# Patient Record
Sex: Male | Born: 1965 | Race: Black or African American | Hispanic: No | Marital: Married | Smoking: Never smoker
Health system: Southern US, Community
[De-identification: ages and names within clinical notes are randomized; demographics above are authoritative.]

## PROBLEM LIST (undated history)

## (undated) DIAGNOSIS — M2550 Pain in unspecified joint: Secondary | ICD-10-CM

## (undated) DIAGNOSIS — Z8611 Personal history of tuberculosis: Secondary | ICD-10-CM

## (undated) DIAGNOSIS — J189 Pneumonia, unspecified organism: Secondary | ICD-10-CM

## (undated) DIAGNOSIS — Z973 Presence of spectacles and contact lenses: Secondary | ICD-10-CM

## (undated) HISTORY — DX: Presence of spectacles and contact lenses: Z97.3

## (undated) HISTORY — PX: CERVICAL DISCECTOMY: SHX98

## (undated) HISTORY — DX: Personal history of tuberculosis: Z86.11

## (undated) HISTORY — PX: LEG SURGERY: SHX1003

## (undated) HISTORY — PX: LIPOMA EXCISION: SHX5283

## (undated) HISTORY — DX: Pain in unspecified joint: M25.50

## (undated) HISTORY — DX: Pneumonia, unspecified organism: J18.9

## (undated) HISTORY — PX: NECK SURGERY: SHX720

---

## 2003-12-09 ENCOUNTER — Emergency Department (HOSPITAL_COMMUNITY): Admission: EM | Admit: 2003-12-09 | Discharge: 2003-12-09 | Payer: Self-pay | Admitting: Emergency Medicine

## 2005-02-03 ENCOUNTER — Ambulatory Visit: Payer: Self-pay | Admitting: Internal Medicine

## 2005-02-10 ENCOUNTER — Ambulatory Visit: Payer: Self-pay | Admitting: Nurse Practitioner

## 2005-02-10 ENCOUNTER — Ambulatory Visit: Payer: Self-pay | Admitting: *Deleted

## 2005-02-13 ENCOUNTER — Ambulatory Visit (HOSPITAL_COMMUNITY): Admission: RE | Admit: 2005-02-13 | Discharge: 2005-02-13 | Payer: Self-pay | Admitting: Internal Medicine

## 2005-02-22 ENCOUNTER — Ambulatory Visit: Payer: Self-pay | Admitting: Nurse Practitioner

## 2005-02-28 ENCOUNTER — Emergency Department (HOSPITAL_COMMUNITY): Admission: EM | Admit: 2005-02-28 | Discharge: 2005-03-01 | Payer: Self-pay | Admitting: Emergency Medicine

## 2005-03-01 ENCOUNTER — Ambulatory Visit: Payer: Self-pay | Admitting: Nurse Practitioner

## 2005-03-09 ENCOUNTER — Ambulatory Visit (HOSPITAL_COMMUNITY): Admission: RE | Admit: 2005-03-09 | Discharge: 2005-03-09 | Payer: Self-pay | Admitting: Neurosurgery

## 2005-03-14 ENCOUNTER — Encounter: Admission: RE | Admit: 2005-03-14 | Discharge: 2005-03-14 | Payer: Self-pay | Admitting: Neurosurgery

## 2005-04-04 ENCOUNTER — Ambulatory Visit (HOSPITAL_COMMUNITY): Admission: RE | Admit: 2005-04-04 | Discharge: 2005-04-05 | Payer: Self-pay | Admitting: Neurosurgery

## 2006-07-25 IMAGING — CT CT CERVICAL SPINE W/ CM
3 of 7 series · 14 of 33 positions shown, 16 images · IV contrast (omnipaque)
Comparison: none

CLINICAL DATA: Prior C6-7 fusion.  Clinical concern for right C7-T1 HNP.  Right C8 radiculopathy.  
CERVICAL MYELOGRAM:
TECHNIQUE: Dr. Derriecka Reib instilled Omnipaque 300 into the subarachnoid space at L3-4 level.  I maneuvered the contrast material into the cervical region and obtained standard spot films.

[Series 102: cervical spine · axial · 0.27mm/px · z∈[-213,-70]mm · 8 of 368 slices shown, 10 images]
[im 41/368  soft-tissue]
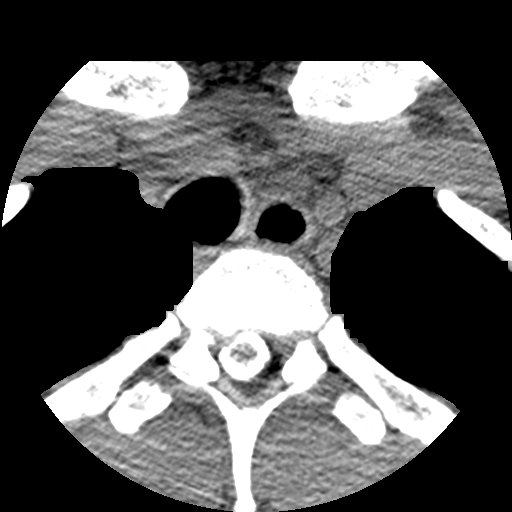
[im 41/368  bone]
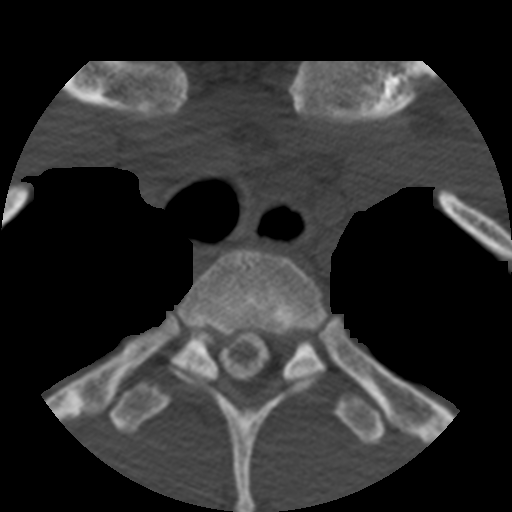
[im 82/368  bone]
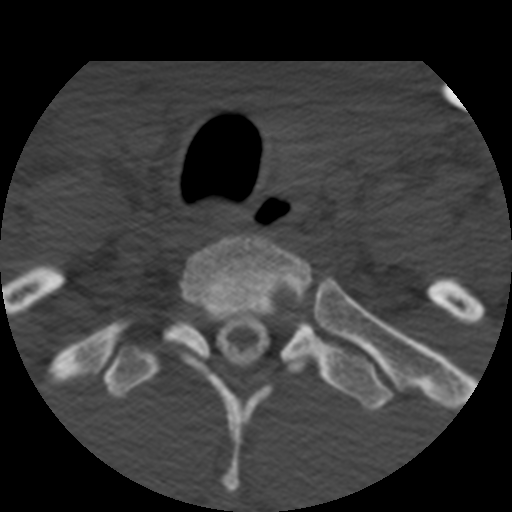
[im 123/368  bone]
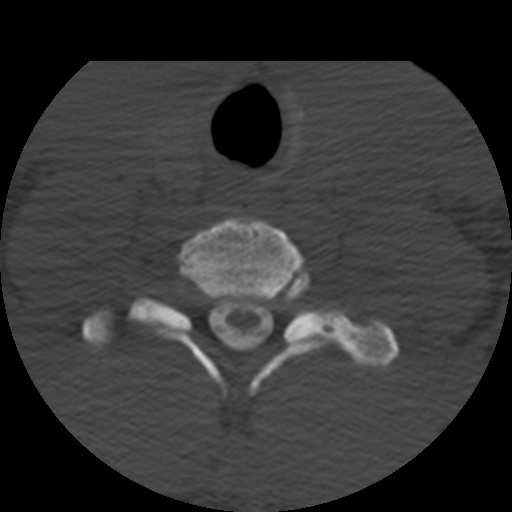
[im 164/368  bone]
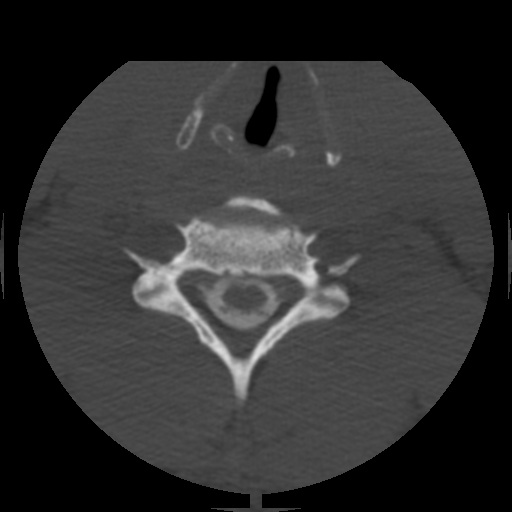
[im 204/368  soft-tissue]
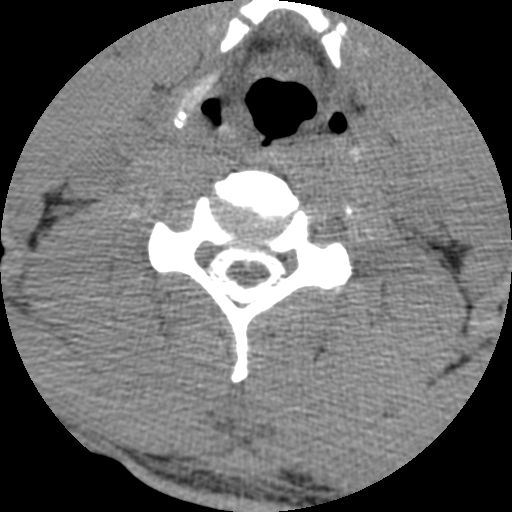
[im 204/368  bone]
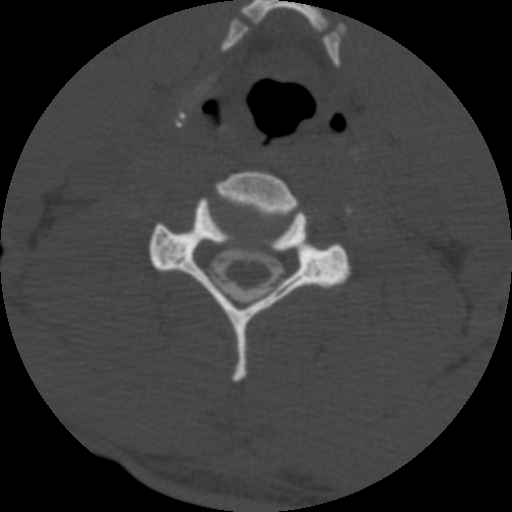
[im 245/368  bone]
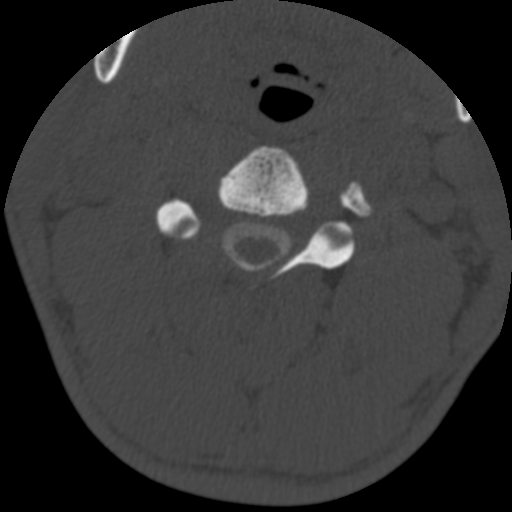
[im 286/368  bone]
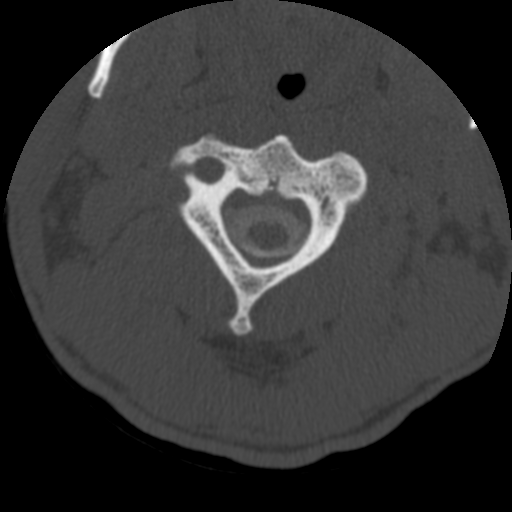
[im 327/368  bone]
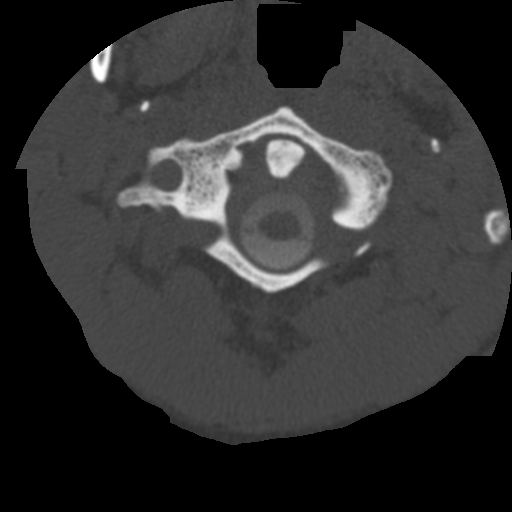

[Series 103: reformatted · sagittal · 0.35mm/px · 5 of 49 slices shown (1 of 2)]
[im 9/49  bone]
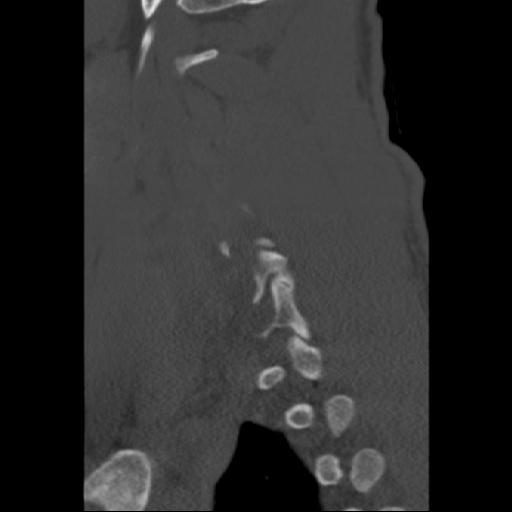
[im 17/49  bone]
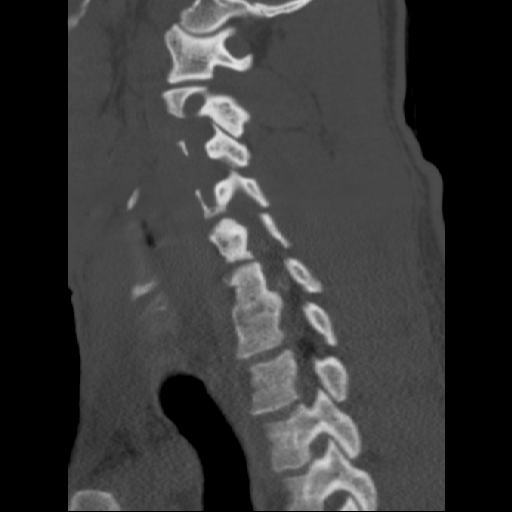
[im 25/49  bone]
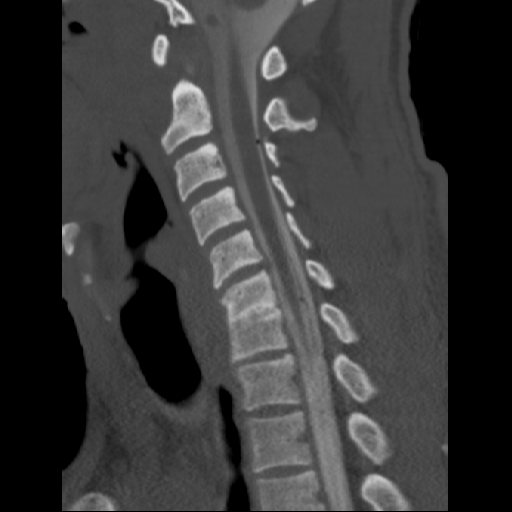
[im 33/49  bone]
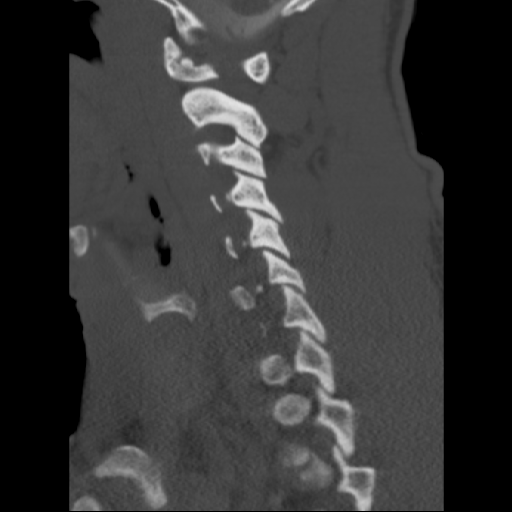
[im 41/49  bone]
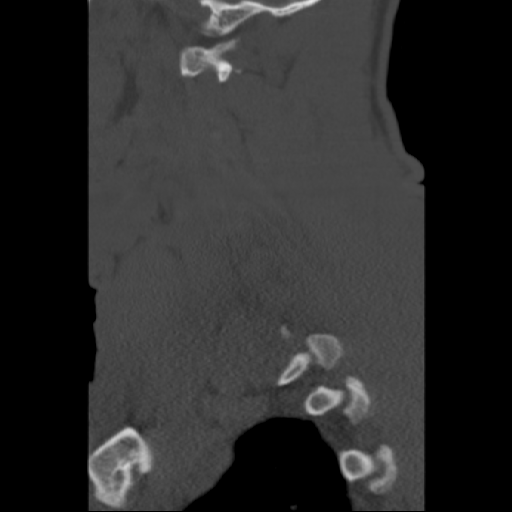

[Series 104: reformatted · coronal · 0.37mm/px · 1 of 40 slices shown (2 of 2)]
[im 20/40  bone]
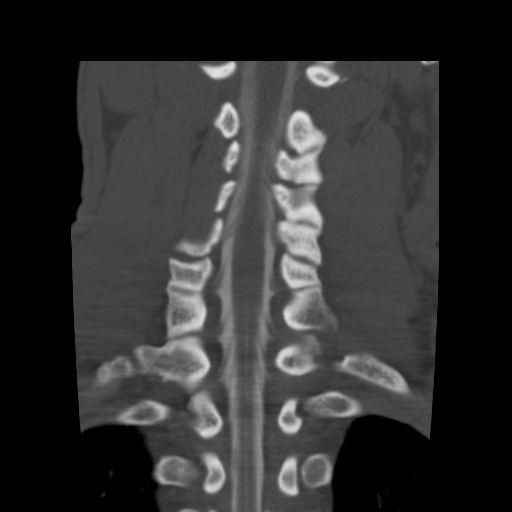

[14 of 33 positions shown; findings below may reference images not displayed]

FINDINGS: Evidence of prior interbody fusion, C6-7, which appears solid.  Minimal anterior extradural defect centrally at C6-7.  Anterolateral extradural defects, C5-6 mainly on the left, probably due to foraminal stenosis and moderately small disk protrusion as noted on MRI 02/13/05.  Anterolateral extradural defect on the right at C6-7 due to bony hypertrophy of the vertebral bodies/uncinate process.   The MRI revealed disk protrusions posterior lateral/lateral, right greater than left at C7-T1 plus bony hypertrophy.  It is difficult to optimally appreciate the extradural defects on the oblique myelographic films.  CT is to follow.
IMPRESSION: Extradural defects as described above.  
POST MYELOGRAM CT CERVICAL SPINE:
Following myelogram, transaxial cuts were acquired. From the axial data set, images were constructed in coronal and sagittal planes.  
Lower cervical scoliosis convex to the right.  Solid appearing interbody fusion at C6-7.  Mild kyphotic angulation centered at C6-7.  Bony hypertrophy of the vertebral bodies on the right at C6-7 with some foraminal encroachment.  Findings compatible with HNP.  Posterolateral/lateral disk herniation on the right at C7-T1 with encroachment on the right C8 nerve root.  Smaller disk protrusion and osteophyte formation posterolateral on the left at C7-T1.  Mild eccentric annular bulging on the right at T1-2.  HNP and bony hypertrophy posterior lateral/lateral right T2-3.  Indentation anterolateral thecal sac due to bony hypertrophy. 
Also appreciated is disk protrusion, posterolateral on the left at C5-6.
IMPRESSION: Multiple disk protrusions as described above.  Main clinical question was whether there was an HNP on the right at C7-T1 in this patient with reported right T8 radiculopathy.  Note that there are findings compatible with HNP and spondylotic changes laterally on the right at C7-T1.  See comments above.

## 2006-07-25 IMAGING — CR DG MYELOGRAM CERVICAL
2 series · 2 of 2 positions shown · IV contrast (omnipaque)
Comparison: none

CLINICAL DATA: Prior C6-7 fusion.  Clinical concern for right C7-T1 HNP.  Right C8 radiculopathy.  
CERVICAL MYELOGRAM:
TECHNIQUE: Dr. Derriecka Reib instilled Omnipaque 300 into the subarachnoid space at L3-4 level.  I maneuvered the contrast material into the cervical region and obtained standard spot films.

[view not recorded (1 of 2)]
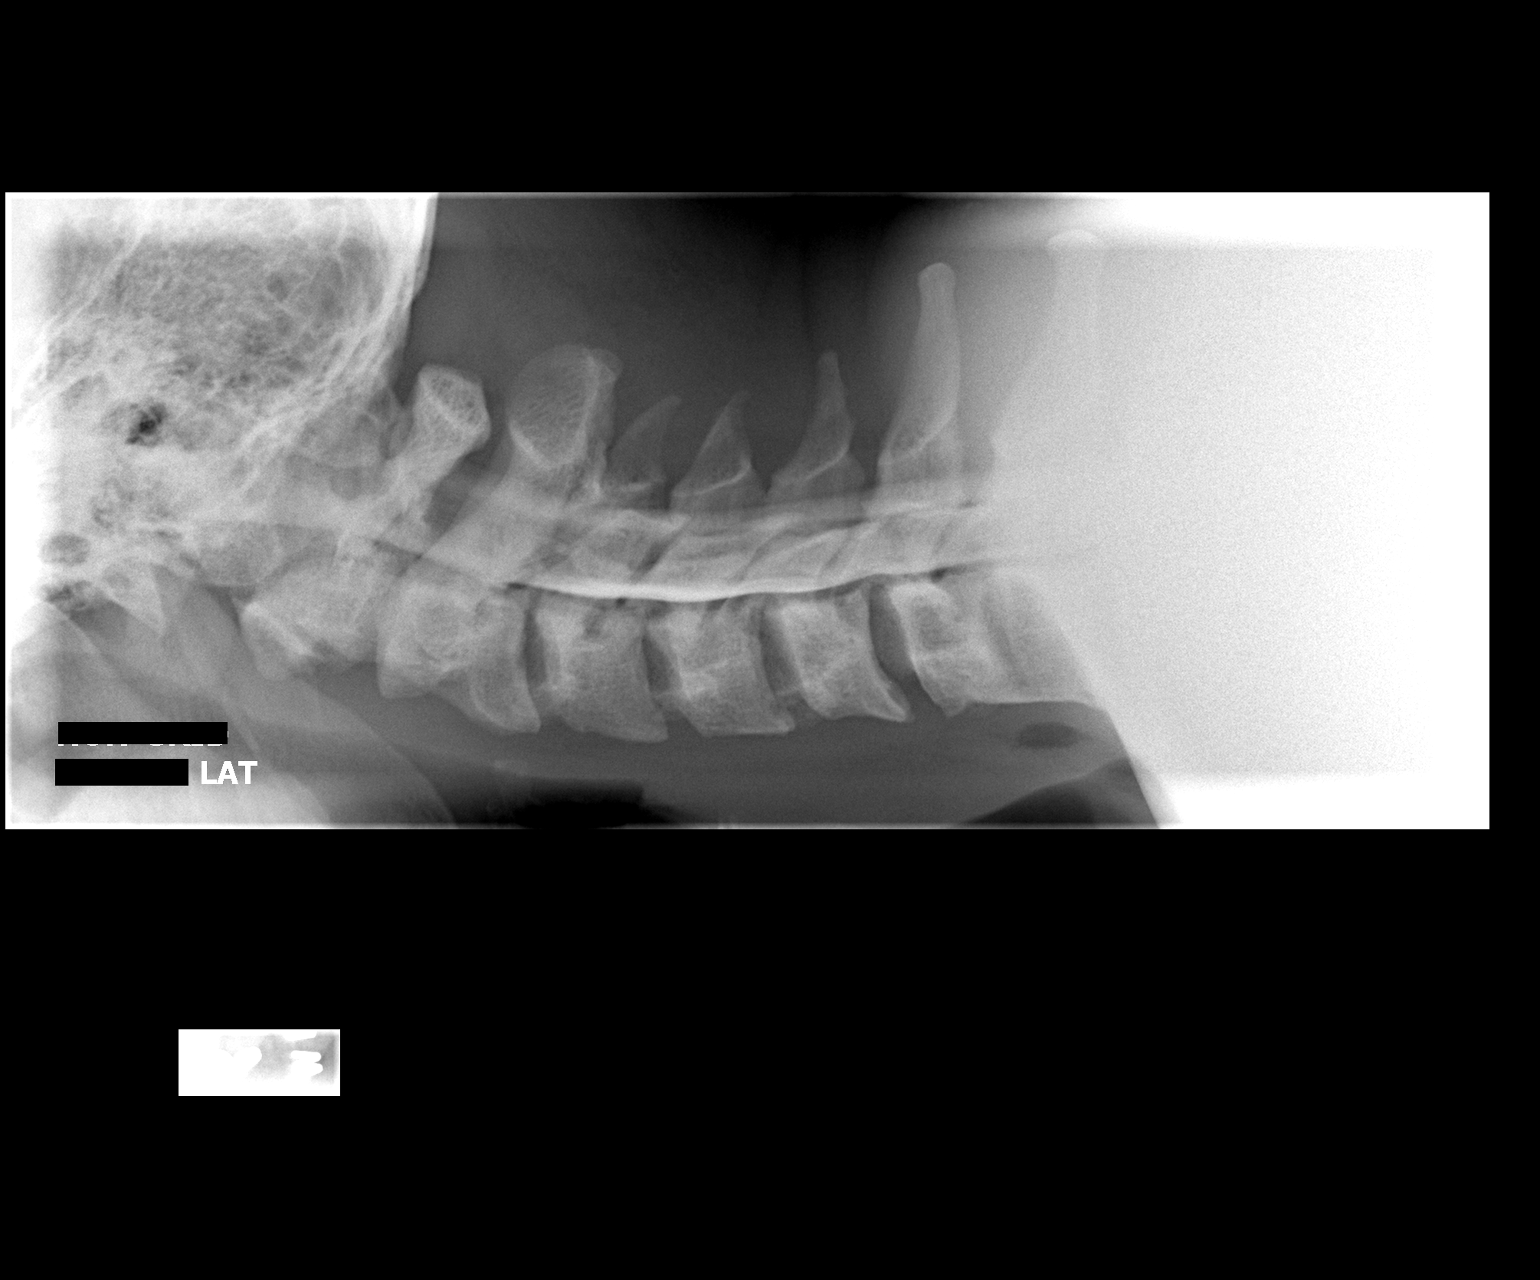

[view not recorded (2 of 2)]
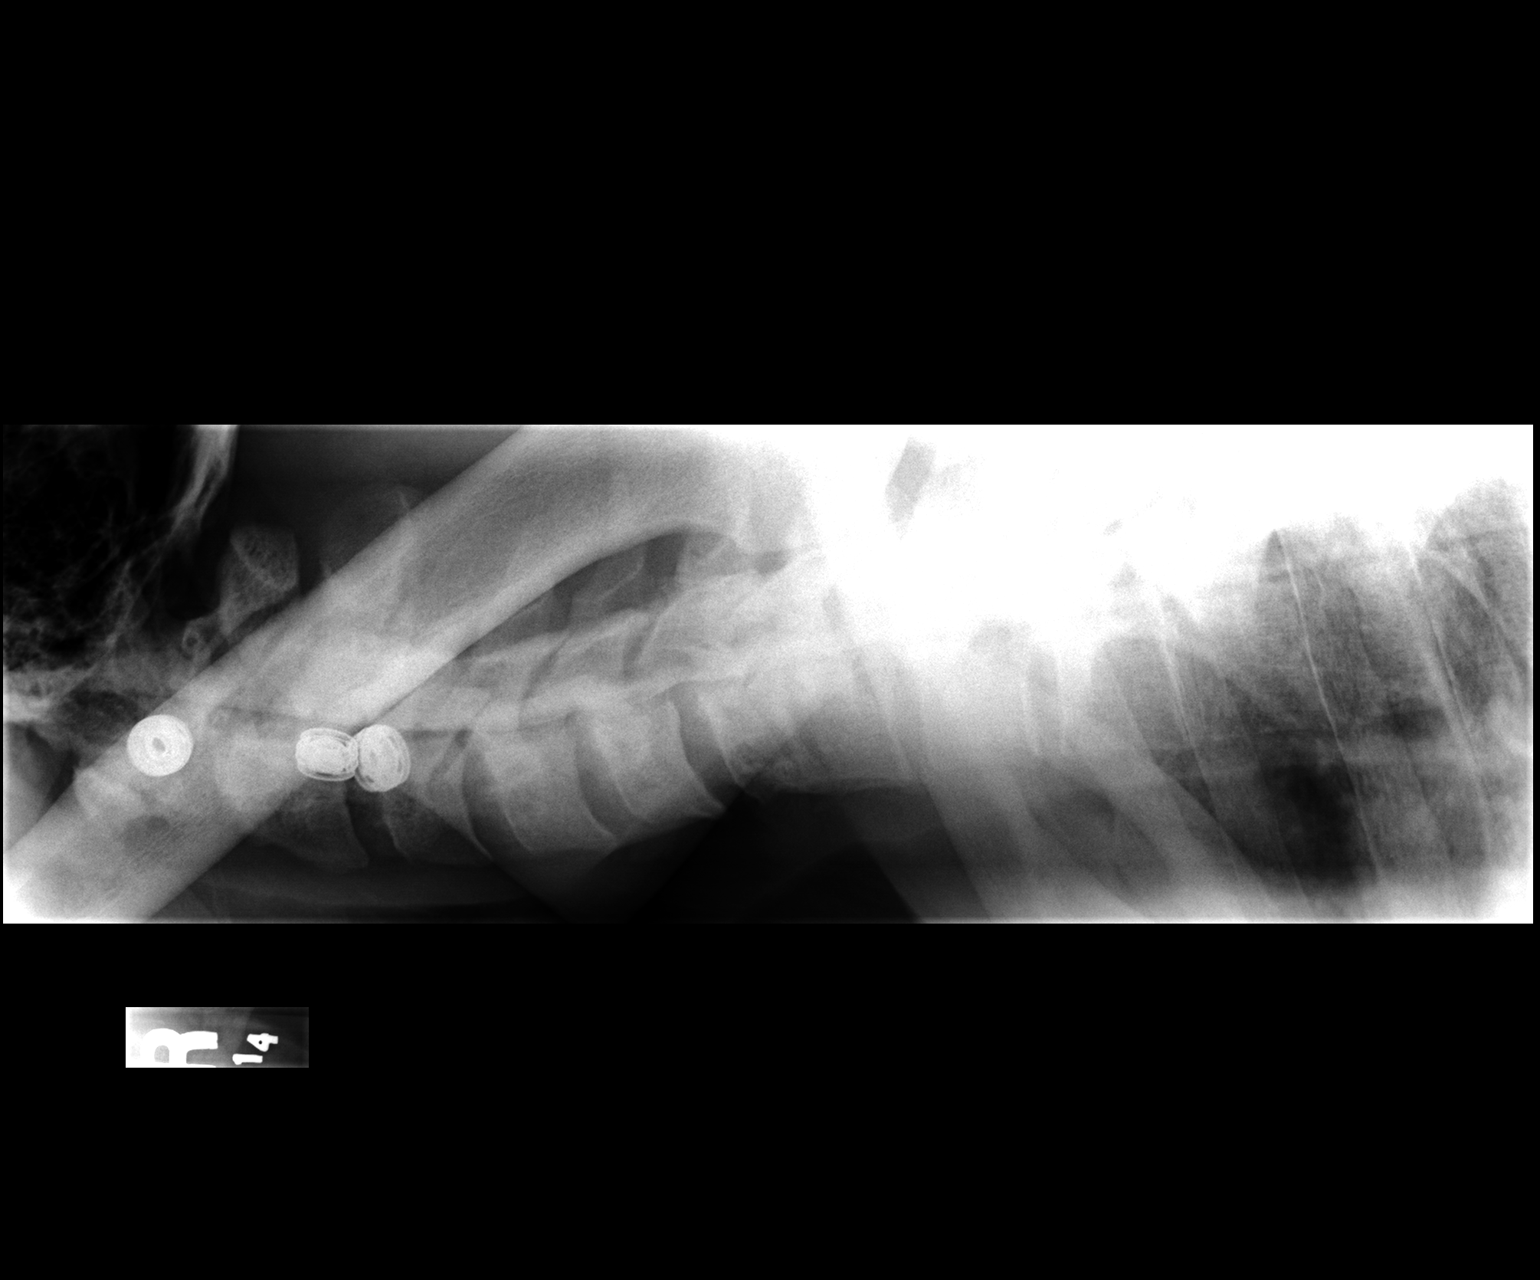

[2 of 2 positions shown; findings below may reference images not displayed]

FINDINGS: Evidence of prior interbody fusion, C6-7, which appears solid.  Minimal anterior extradural defect centrally at C6-7.  Anterolateral extradural defects, C5-6 mainly on the left, probably due to foraminal stenosis and moderately small disk protrusion as noted on MRI 02/13/05.  Anterolateral extradural defect on the right at C6-7 due to bony hypertrophy of the vertebral bodies/uncinate process.   The MRI revealed disk protrusions posterior lateral/lateral, right greater than left at C7-T1 plus bony hypertrophy.  It is difficult to optimally appreciate the extradural defects on the oblique myelographic films.  CT is to follow.
IMPRESSION: Extradural defects as described above.  
POST MYELOGRAM CT CERVICAL SPINE:
Following myelogram, transaxial cuts were acquired. From the axial data set, images were constructed in coronal and sagittal planes.  
Lower cervical scoliosis convex to the right.  Solid appearing interbody fusion at C6-7.  Mild kyphotic angulation centered at C6-7.  Bony hypertrophy of the vertebral bodies on the right at C6-7 with some foraminal encroachment.  Findings compatible with HNP.  Posterolateral/lateral disk herniation on the right at C7-T1 with encroachment on the right C8 nerve root.  Smaller disk protrusion and osteophyte formation posterolateral on the left at C7-T1.  Mild eccentric annular bulging on the right at T1-2.  HNP and bony hypertrophy posterior lateral/lateral right T2-3.  Indentation anterolateral thecal sac due to bony hypertrophy. 
Also appreciated is disk protrusion, posterolateral on the left at C5-6.
IMPRESSION: Multiple disk protrusions as described above.  Main clinical question was whether there was an HNP on the right at C7-T1 in this patient with reported right T8 radiculopathy.  Note that there are findings compatible with HNP and spondylotic changes laterally on the right at C7-T1.  See comments above.

## 2010-04-15 ENCOUNTER — Ambulatory Visit: Payer: Self-pay | Admitting: Family Medicine

## 2010-04-21 ENCOUNTER — Encounter: Admission: RE | Admit: 2010-04-21 | Discharge: 2010-04-21 | Payer: Self-pay | Admitting: Family Medicine

## 2010-06-30 ENCOUNTER — Ambulatory Visit: Payer: Self-pay | Admitting: Family Medicine

## 2010-07-16 ENCOUNTER — Ambulatory Visit: Payer: Self-pay | Admitting: Family Medicine

## 2010-07-27 ENCOUNTER — Ambulatory Visit (HOSPITAL_BASED_OUTPATIENT_CLINIC_OR_DEPARTMENT_OTHER): Admission: RE | Admit: 2010-07-27 | Discharge: 2010-07-27 | Payer: Self-pay | Admitting: General Surgery

## 2010-12-06 ENCOUNTER — Other Ambulatory Visit: Payer: Self-pay | Admitting: Emergency Medicine

## 2010-12-06 ENCOUNTER — Other Ambulatory Visit: Payer: Self-pay | Admitting: Orthopedic Surgery

## 2010-12-06 DIAGNOSIS — M25561 Pain in right knee: Secondary | ICD-10-CM

## 2010-12-06 DIAGNOSIS — R531 Weakness: Secondary | ICD-10-CM

## 2010-12-07 ENCOUNTER — Ambulatory Visit
Admission: RE | Admit: 2010-12-07 | Discharge: 2010-12-07 | Disposition: A | Discharge status: Home / Self Care | Payer: BC Managed Care – PPO | Source: Ambulatory Visit | Attending: Orthopedic Surgery | Admitting: Orthopedic Surgery

## 2010-12-07 DIAGNOSIS — M25561 Pain in right knee: Secondary | ICD-10-CM

## 2010-12-07 DIAGNOSIS — R531 Weakness: Secondary | ICD-10-CM

## 2011-03-11 NOTE — Op Note (Signed)
NAME:  JAHMAI, FINELLI NO.:  [id number]   MEDICAL RECORD NO.:  [id number]          PATIENT TYPE:  OIB   LOCATION:  2899                         FACILITY:  [facility]   PHYSICIAN:  Clydene Fake, M.D.  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   OPERATION PERFORMED:  Anterior cervical decompression and diskectomy with  fusion,  C7-T1 with LifeNet allograft bone. Eagle anterior cervical plate.   SURGEON:  Clydene Fake, M.D.   ASSISTANT:  Stefani Dama, M.D.   ANESTHESIA:  General endotracheal.   ESTIMATED BLOOD LOSS:  200 mL   BLOOD REPLACED:  None.   DRAINS:  None.   COMPLICATIONS:  None.   INDICATIONS FOR PROCEDURE:  The patient is a 45 year old gentleman who  underwent ACF to C6-7 in 1999 and was doing well, but started having neck  and right arm pain, numbness and weakness in the C8 distribution.  He had an  MRI and a myelogram was done showing spondylitic changes at C7-T1 with  __________ narrowing worse at the right side, probable disk herniation  compressing the C8 root.  Patient brought in for decompression and fusion.   DESCRIPTION OF PROCEDURE:  The patient was brought to the operating room and  general anesthesia induced.  The patient was placed in 10 pounds of halter  traction, was prepped and draped in sterile fashion.  The site of incision  was injected with 10 mL of 1% lidocaine with epinephrine.  Incision was then  made in the left side of the neck below the prior incision from the midline  to the anterior border of the sternocleidomastoid muscle.  Incision taken  down to the platysma and  hemostasis obtained with Bovie cautery and the  platysma was incised with the Bovie and blunt dissection was taken to the  anterior cervical fascia.  On the anterior cervical spine there was some  scar in this area.  We carefully dissected to and through and got to the  spine and at the level of the osteophyte what we thought was C7-T1, the  needle was placed in the interspace and x-rays were obtained, confirming our  position.  Longus coli muscles were reflected laterally on each side and  self-retaining retractor was place.  Disk space was incised with a 15 blade  and diskectomy performed with pituitary rongeurs and curets.  Distraction  pins were placed into the C7-T1 vertebral bodies  and interspace distracted.  Microscope was brought in for microdissection.  Using curets and pituitary  rongeurs and then 1 and 2 mm Kerrison punches, diskectomy was then  continued.  Posterior osteophytes were removed.  Posterior ligament removed.  The central canal within the bilateral foramina were decompressed.  We did  extensive foraminal  decompression on the right side,  decompressing out over  the nerve root fairly far laterally.  When we were finished, we had good  central decompression and good lateral decompression on both sides with no  compression on either C8 nerve root.  Hemostasis was obtained with bipolar  cautery, Gelfoam and Thrombin.  Gelfoam was then irrigated out.  We left a  small piece of Gelfoam to the right side of the take off of the C8 root.  That kept the area hemostatic.  High-speed drill was used to remove  cartilaginous end plate. The height of the disk space was measured with the  LifeNet trial was measured 6 mm.  A 6 mm LifeNet allograft bone was then  tapped into place, countersunk about a millimeter.  We checked posterior to  the graft.  There was plenty of room between the dura and the graft.  This  was checked with a nerve hook.  Wound was irrigated with antibiotic   solution.  Distraction pins were removed.  Weight was removed from the  traction and an Eagle anterior cervical plate was placed over the anterior  cervical spine and two screws placed in C7, two in T1. These were tightened  down.  Lateral x-rays were obtained showing good position of plate and  screws and interbody plug at the C7-T1 level.  Retractors were removed.  Hemostasis obtained with bipolar cautery and Gelfoam and Thrombin. Gelfoam  was irrigated out.  When we had good hemostasis, the platysma was closed  with 3-0 Vicryl interrupted suture.  The subcutaneous tissue was closed with  same and the skin closed with benzoin and Steri-Strips.  Dressing was  placed.  Patient was placed back in supine position, awakened from  anesthesia and transferred to the recovery room in stable condition.       JRH/MEDQ  D:  04/04/2005  T:  04/04/2005  Job:  [id number]

## 2012-01-10 ENCOUNTER — Ambulatory Visit (INDEPENDENT_AMBULATORY_CARE_PROVIDER_SITE_OTHER): Payer: 59 | Admitting: Medical

## 2012-01-10 ENCOUNTER — Encounter: Payer: Self-pay | Admitting: Medical

## 2012-01-10 VITALS — BP 112/70 | HR 68 | Temp 98.0°F | Resp 16 | Ht 71.0 in | Wt 186.0 lb

## 2012-01-10 DIAGNOSIS — Z113 Encounter for screening for infections with a predominantly sexual mode of transmission: Secondary | ICD-10-CM

## 2012-01-10 DIAGNOSIS — M25561 Pain in right knee: Secondary | ICD-10-CM

## 2012-01-10 DIAGNOSIS — Z0000 Encounter for general adult medical examination without abnormal findings: Secondary | ICD-10-CM

## 2012-01-10 DIAGNOSIS — M25532 Pain in left wrist: Secondary | ICD-10-CM

## 2012-01-10 DIAGNOSIS — M25569 Pain in unspecified knee: Secondary | ICD-10-CM

## 2012-01-10 DIAGNOSIS — R634 Abnormal weight loss: Secondary | ICD-10-CM

## 2012-01-10 DIAGNOSIS — M25539 Pain in unspecified wrist: Secondary | ICD-10-CM

## 2012-01-10 DIAGNOSIS — Z809 Family history of malignant neoplasm, unspecified: Secondary | ICD-10-CM

## 2012-01-10 LAB — LIPID PANEL
HDL: 88 mg/dL (ref >39)
LDL Cholesterol: 111 mg/dL — ABNORMAL HIGH (ref 0–99)

## 2012-01-10 LAB — PSA: PSA: 0.57 ng/mL (ref <=4.00)

## 2012-01-10 LAB — COMPREHENSIVE METABOLIC PANEL
ALT: 13 U/L (ref 0–53)
Albumin: 4.5 g/dL (ref 3.5–5.2)
CO2: 23 mEq/L (ref 19–32)
Glucose, Bld: 85 mg/dL (ref 70–99)
Potassium: 4.4 mEq/L (ref 3.5–5.3)
Sodium: 137 mEq/L (ref 135–145)
Total Protein: 7.5 g/dL (ref 6.0–8.3)

## 2012-01-10 LAB — POCT URINALYSIS DIPSTICK
Bilirubin, UA: NEGATIVE
Glucose, UA: NEGATIVE
Ketones, UA: NEGATIVE
Spec Grav, UA: 1.02

## 2012-01-10 LAB — TSH: TSH: 1.203 u[IU]/mL (ref 0.350–4.500)

## 2012-01-10 MED ORDER — MELOXICAM 15 MG PO TABS: 15.0000 mg | ORAL_TABLET | Freq: Every day | ORAL | Status: AC
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
## 2012-01-11 LAB — GC/CHLAMYDIA PROBE AMP, URINE
Chlamydia, Swab/Urine, PCR: NEGATIVE
GC Probe Amp, Urine: NEGATIVE

## 2012-01-11 LAB — CBC WITH DIFFERENTIAL/PLATELET
Basophils Absolute: 0 10*3/uL (ref 0.0–0.1)
Eosinophils Absolute: 0.1 10*3/uL (ref 0.0–0.7)
Lymphs Abs: 1.7 10*3/uL (ref 0.7–4.0)
MCH: 30.4 pg (ref 26.0–34.0)
Neutrophils Relative %: 47 % (ref 43–77)
Platelets: 245 10*3/uL (ref 150–400)
RBC: 4.54 MIL/uL (ref 4.22–5.81)
RDW: 13.4 % (ref 11.5–15.5)
WBC: 4.1 10*3/uL (ref 4.0–10.5)

## 2012-01-11 LAB — SEDIMENTATION RATE: Sed Rate: 1 mm/hr (ref 0–16)

## 2012-01-11 LAB — HIV ANTIBODY (ROUTINE TESTING W REFLEX): HIV: NONREACTIVE

## 2012-01-31 ENCOUNTER — Telehealth: Payer: Self-pay | Admitting: Family Medicine

## 2012-01-31 NOTE — Telephone Encounter (Signed)
Message copied by Janeice Robinson on Tue Jan 31, 2012  4:13 PM ------      Message from: Jac Canavan      Created: Tue Jan 31, 2012  1:38 PM       Chest xray overread by radiologist showed normal CXR.  He is supposed to come back in for weight check 4/19

## 2012-01-31 NOTE — Telephone Encounter (Signed)
Patient was notified of his CXR report and to F/U for a weight check. CLS

## 2012-02-01 ENCOUNTER — Encounter: Payer: Self-pay | Admitting: Internal Medicine

## 2012-02-10 ENCOUNTER — Other Ambulatory Visit: Payer: 59

## 2013-07-24 ENCOUNTER — Encounter: Payer: Self-pay | Admitting: Medical

## 2013-07-30 ENCOUNTER — Encounter: Payer: Self-pay | Admitting: Medical

## 2013-07-30 ENCOUNTER — Ambulatory Visit (INDEPENDENT_AMBULATORY_CARE_PROVIDER_SITE_OTHER): Payer: Managed Care, Other (non HMO) | Admitting: Medical

## 2013-07-30 VITALS — BP 112/80 | HR 58 | Temp 97.7°F | Resp 16 | Ht 71.0 in | Wt 194.0 lb

## 2013-07-30 DIAGNOSIS — M19049 Primary osteoarthritis, unspecified hand: Secondary | ICD-10-CM

## 2013-07-30 DIAGNOSIS — Z0000 Encounter for general adult medical examination without abnormal findings: Secondary | ICD-10-CM

## 2013-07-30 DIAGNOSIS — L03019 Cellulitis of unspecified finger: Secondary | ICD-10-CM

## 2013-07-30 DIAGNOSIS — M65839 Other synovitis and tenosynovitis, unspecified forearm: Secondary | ICD-10-CM

## 2013-07-30 DIAGNOSIS — M659 Synovitis and tenosynovitis, unspecified: Secondary | ICD-10-CM

## 2013-07-30 DIAGNOSIS — L03012 Cellulitis of left finger: Secondary | ICD-10-CM

## 2013-07-30 DIAGNOSIS — Z23 Encounter for immunization: Secondary | ICD-10-CM

## 2013-07-30 DIAGNOSIS — M19042 Primary osteoarthritis, left hand: Secondary | ICD-10-CM

## 2013-07-30 LAB — POCT URINALYSIS DIPSTICK
Blood, UA: NEGATIVE
Ketones, UA: NEGATIVE
Protein, UA: NEGATIVE
Spec Grav, UA: 1.02
Urobilinogen, UA: NEGATIVE
pH, UA: 5

## 2013-07-30 MED ORDER — MELOXICAM 15 MG PO TABS: 15.0000 mg | ORAL_TABLET | Freq: Every day | ORAL | Status: DC

## 2013-07-30 MED ORDER — CEPHALEXIN 500 MG PO CAPS: 500.0000 mg | ORAL_CAPSULE | Freq: Three times a day (TID) | ORAL | Status: DC
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
## 2013-07-30 NOTE — Patient Instructions (Signed)
Left pinky finger swelling  It appears that you had recent paronychia infection.  This usually requires incision and drainage, but it appears to be healing.  I will write a prescription for Keflex antibiotic though you can take to help clear this up.  Take all of the antibiotic.   You also have swelling of the finger joint suggesting osteoarthritis.  Begin Mobic anti-inflammatory daily to help with swelling and pain. Take this with food.  Your left wrist pain is due to tenosynovitis or inflammation of the tendon sheath and joint.  Mobic should help with this as well.  Use mobic daily for the next month and see how this helps.   Use ice when swollen or worse.    My main recommendations today are to limit alcohol, continue staying active and exercising, eat a healthy low cholesterol diet.  If you want the skin tag removed, then return for excision.   Paronychia Paronychia is an inflammatory reaction involving the folds of the skin surrounding the fingernail. This is commonly caused by an infection in the skin around a nail. The most common cause of paronychia is frequent wetting of the hands (as seen with bartenders, food servers, nurses or others who wet their hands). This makes the skin around the fingernail susceptible to infection by bacteria (germs) or fungus. Other predisposing factors are:  Aggressive manicuring.  Nail biting.  Thumb sucking. The most common cause is a staphylococcal (a type of germ) infection, or a fungal (Candida) infection. When caused by a germ, it usually comes on suddenly with redness, swelling, pus and is often painful. It may get under the nail and form an abscess (collection of pus), or form an abscess around the nail. If the nail itself is infected with a fungus, the treatment is usually prolonged and may require oral medicine for up to one year. Your caregiver will determine the length of time treatment is required. The paronychia caused by bacteria (germs) may  largely be avoided by not pulling on hangnails or picking at cuticles. When the infection occurs at the tips of the finger it is called felon. When the cause of paronychia is from the herpes simplex virus (HSV) it is called herpetic whitlow. TREATMENT  When an abscess is present treatment is often incision and drainage. This means that the abscess must be cut open so the pus can get out. When this is done, the following home care instructions should be followed. HOME CARE INSTRUCTIONS   It is important to keep the affected fingers very dry. Rubber or plastic gloves over cotton gloves should be used whenever the hand must be placed in water.  Keep wound clean, dry and dressed as suggested by your caregiver between warm soaks or warm compresses.  Soak in warm water for fifteen to twenty minutes three to four times per day for bacterial infections. Fungal infections are very difficult to treat, so often require treatment for long periods of time.  For bacterial (germ) infections take antibiotics (medicine which kill germs) as directed and finish the prescription, even if the problem appears to be solved before the medicine is gone.  Only take over-the-counter or prescription medicines for pain, discomfort, or fever as directed by your caregiver. SEEK IMMEDIATE MEDICAL CARE IF:  You have redness, swelling, or increasing pain in the wound.  You notice pus coming from the wound.  You have a fever.  You notice a bad smell coming from the wound or dressing. Document Released: 04/05/2001 Document Revised:  01/02/2012 Document Reviewed: 12/05/2008 [employer] Patient Information 2014 [place], [place].     Osteoarthritis Osteoarthritis is the most common form of arthritis. It is redness, soreness, and swelling (inflammation) affecting the cartilage. Cartilage acts as a cushion, covering the ends of bones where they meet to form a joint. CAUSES  Over time, the cartilage begins to wear away. This causes  bone to rub on bone. This produces pain and stiffness in the affected joints. Factors that contribute to this problem are:  Excessive body weight.  Age.  Overuse of joints. SYMPTOMS   People with osteoarthritis usually experience joint pain, swelling, or stiffness.  Over time, the joint may lose its normal shape.  Small deposits of bone (osteophytes) may grow on the edges of the joint.  Bits of bone or cartilage can break off and float inside the joint space. This may cause more pain and damage.  Osteoarthritis can lead to depression, anxiety, feelings of helplessness, and limitations on daily activities. The most commonly affected joints are in the:  Ends of the fingers.  Thumbs.  Neck.  Lower back.  Knees.  Hips. DIAGNOSIS  Diagnosis is mostly based on your symptoms and exam. Tests may be helpful, including:  X-rays of the affected joint.  A computerized magnetic scan (MRI).  Blood tests to rule out other types of arthritis.  Joint fluid tests. This involves using a needle to draw fluid from the joint and examining the fluid under a microscope. TREATMENT  Goals of treatment are to control pain, improve joint function, maintain a normal body weight, and maintain a healthy lifestyle. Treatment approaches may include:  A prescribed exercise program with rest and joint relief.  Weight control with nutritional education.  Pain relief techniques such as:  Properly applied heat and cold.  Electric pulses delivered to nerve endings under the skin (transcutaneous electrical nerve stimulation, TENS).  Massage.  Certain supplements. Ask your caregiver before using any supplements, especially in combination with prescribed drugs.  Medicines to control pain, such as:  Acetaminophen.  Nonsteroidal anti-inflammatory drugs (NSAIDs), such as naproxen.  Narcotic or central-acting agents, such as tramadol. This drug carries a risk of addiction and is generally  prescribed for short-term use.  Corticosteroids. These can be given orally or as injection. This is a short-term treatment, not recommended for routine use.  Surgery to reposition the bones and relieve pain (osteotomy) or to remove loose pieces of bone and cartilage. Joint replacement may be needed in advanced states of osteoarthritis. HOME CARE INSTRUCTIONS  Your caregiver can recommend specific types of exercise. These may include:  Strengthening exercises. These are done to strengthen the muscles that support joints affected by arthritis. They can be performed with weights or with exercise bands to add resistance.  Aerobic activities. These are exercises, such as brisk walking or low-impact aerobics, that get your heart pumping. They can help keep your lungs and circulatory system in shape.  Range-of-motion activities. These keep your joints limber.  Balance and agility exercises. These help you maintain daily living skills. Learning about your condition and being actively involved in your care will help improve the course of your osteoarthritis. SEEK MEDICAL CARE IF:   You feel hot or your skin turns red.  You develop a rash in addition to your joint pain.  You have an oral temperature above 102 F (38.9 C). FOR MORE INFORMATION  National Institute of Arthritis and Musculoskeletal and Skin Diseases: www.niams.http://www.myers.net/ [employer] on Aging: https://walker.com/ American College of Rheumatology: www.rheumatology.org  Document Released: 10/10/2005 Document Revised: 01/02/2012 Document Reviewed: 01/21/2010 [facility] Patient Information 2014 [place], [place].

## 2013-12-18 ENCOUNTER — Other Ambulatory Visit: Payer: Self-pay | Admitting: Medical

## 2013-12-18 NOTE — Telephone Encounter (Signed)
Is this okay to refill? 

## 2016-08-12 ENCOUNTER — Ambulatory Visit (INDEPENDENT_AMBULATORY_CARE_PROVIDER_SITE_OTHER): Payer: Managed Care, Other (non HMO) | Admitting: Medical

## 2016-08-12 ENCOUNTER — Encounter: Payer: Self-pay | Admitting: Medical

## 2016-08-12 VITALS — BP 120/88 | HR 62 | Temp 98.0°F | Ht 71.5 in | Wt 192.8 lb

## 2016-08-12 DIAGNOSIS — R21 Rash and other nonspecific skin eruption: Secondary | ICD-10-CM | POA: Diagnosis not present

## 2016-08-12 DIAGNOSIS — L249 Irritant contact dermatitis, unspecified cause: Secondary | ICD-10-CM

## 2016-08-12 MED ORDER — TRIAMCINOLONE ACETONIDE 0.1 % EX CREA: 1.0000 "application " | TOPICAL_CREAM | Freq: Two times a day (BID) | CUTANEOUS | 0 refills | Status: DC

## 2016-08-12 MED ORDER — PREDNISONE 10 MG PO TABS: ORAL_TABLET | ORAL | 0 refills | Status: DC

## 2016-08-12 NOTE — Patient Instructions (Signed)
Encounter Diagnoses  Name Primary?  . Irritant contact dermatitis, unspecified trigger Yes  . Rash and nonspecific skin eruption   . Need for prophylactic vaccination and inoculation against influenza     Recommendations:  Begin Prednisone oral steroid taper I sent to pharmacy  Begin triamcinolone cream to arms and neck but NOT the face  You can use the OTC hydrocortisone cream for the face for up to a week  Continue OTC benadryl tablets 25mg  each.   You can take 1/2 tablet in the day every 6 hours, and 1-2 whole tablets (25-50mg ) at bedtime the next few days  If not much improved within the next 48 hours call back.

## 2016-08-12 NOTE — Progress Notes (Signed)
Subjective: Chief Complaint  Patient presents with  . Establish Care    New-Established patient  . Rash    x8 days, Facial, fine bumps,  bilateral arms, neck, itching, swelling at night, tried Hydrocortisone and Benadryl does not seem to alleviate, thinks got rash from cleaning out a nasty car   Here for to re-establish care. Last visit was for physical 2014 here with me.  Here for rash.  Was cleaning a car, thinks he was exposed to dirt, grime, and lots of dog hair.  No other known recent exposure.   No SOB, no fever, no recent illness.   Has rash on hands, face, ears, arms, neck.  None on torso, legs.  Rash x about a week.  Very itchy.  Using OTC hydrocortisone cream, OTC benadryl.    otherwise been in usual state of health without c/o  Past Medical History:  Diagnosis Date  . History of tuberculosis    50yo, treated and resolved  . Joint pain    right knee, left wrist, ortho consult 10/2010  . Pneumonia    age 50  . Wears glasses    No current outpatient prescriptions on file prior to visit.   No current facility-administered medications on file prior to visit.    ROS as in subjective   Objective: BP 120/88 (BP Location: Right Arm, Patient Position: Sitting, Cuff Size: Large)   Pulse 62   Temp 98 F (36.7 C) (Oral)   Ht 5' 11.5" (1.816 m)   Wt 192 lb 12.8 oz (87.5 kg)   SpO2 98%   BMI 26.52 kg/m   Gen: wd, wn, nad Lungs clear Heart rrr, normal s1, s2, no murmurs No edema No lymph nodes enlarged/inflamed bilat forearms and hands without palms with maculopapularly rash/fine rash throughout, and slight similar on left eye lid.  Otherwise no rash appreciated.  No wheals, no other abnormal findings.   Assessment: Encounter Diagnoses  Name Primary?  . Irritant contact dermatitis, unspecified trigger Yes  . Rash and nonspecific skin eruption     Plan: discussed findings, likely triggers, and treatment.  Recommendations:  Begin Prednisone oral steroid taper I  sent to pharmacy  Begin triamcinolone cream to arms and neck but NOT the face  You can use the OTC hydrocortisone cream for the face for up to a week  Continue OTC benadryl tablets 25mg  each.   You can take 1/2 tablet in the day every 6 hours, and 1-2 whole tablets (25-50mg ) at bedtime the next few days  If not much improved within the next 48 hours call back.   F/u soon for physical  Channing MuttersRoy was seen today for establish care and rash.  Diagnoses and all orders for this visit:  Irritant contact dermatitis, unspecified trigger  Rash and nonspecific skin eruption  Other orders -     Cancel: Flu Vaccine QUAD 36+ mos IM -     predniSONE (DELTASONE) 10 MG tablet; 6/5/4/3/2/1 taper -     triamcinolone cream (KENALOG) 0.1 %; Apply 1 application topically 2 (two) times daily.

## 2018-02-26 ENCOUNTER — Encounter (HOSPITAL_BASED_OUTPATIENT_CLINIC_OR_DEPARTMENT_OTHER): Payer: Self-pay | Admitting: *Deleted

## 2018-02-26 ENCOUNTER — Other Ambulatory Visit: Payer: Self-pay

## 2018-02-26 ENCOUNTER — Emergency Department (HOSPITAL_BASED_OUTPATIENT_CLINIC_OR_DEPARTMENT_OTHER)
Admission: EM | Admit: 2018-02-26 | Discharge: 2018-02-27 | Disposition: A | Discharge status: Home / Self Care | Payer: 59 | Attending: Emergency Medicine | Admitting: Emergency Medicine

## 2018-02-26 DIAGNOSIS — W108XXA Fall (on) (from) other stairs and steps, initial encounter: Secondary | ICD-10-CM | POA: Insufficient documentation

## 2018-02-26 DIAGNOSIS — Y9389 Activity, other specified: Secondary | ICD-10-CM | POA: Insufficient documentation

## 2018-02-26 DIAGNOSIS — Y998 Other external cause status: Secondary | ICD-10-CM | POA: Diagnosis not present

## 2018-02-26 DIAGNOSIS — M545 Low back pain, unspecified: Secondary | ICD-10-CM

## 2018-02-26 DIAGNOSIS — Y929 Unspecified place or not applicable: Secondary | ICD-10-CM | POA: Insufficient documentation

## 2018-02-26 NOTE — ED Triage Notes (Signed)
Back injury. He fell down 8 steps yesterday am. He is ambulatory.

## 2018-02-27 MED ORDER — OXYCODONE-ACETAMINOPHEN 5-325 MG PO TABS: 1.0000 | ORAL_TABLET | ORAL | 0 refills | Status: DC | PRN

## 2018-02-27 MED ORDER — OXYCODONE-ACETAMINOPHEN 5-325 MG PO TABS
1.0000 | ORAL_TABLET | Freq: Once | ORAL | Status: AC
  Administered 2018-02-27: 1 via ORAL
  Filled 2018-02-27: qty 1

## 2018-02-27 MED ORDER — CYCLOBENZAPRINE HCL 10 MG PO TABS
10.0000 mg | ORAL_TABLET | Freq: Once | ORAL | Status: AC
  Administered 2018-02-27: 10 mg via ORAL
  Filled 2018-02-27: qty 1

## 2018-02-27 MED ORDER — CYCLOBENZAPRINE HCL 10 MG PO TABS: 10.0000 mg | ORAL_TABLET | Freq: Three times a day (TID) | ORAL | 0 refills | Status: DC | PRN

## 2018-02-27 NOTE — ED Notes (Addendum)
Note filed in error.

## 2018-02-27 NOTE — Discharge Instructions (Signed)
Apply ice several times a day.  Continue taking two naproxen tablets at a time, twice a day.
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
## 2018-02-27 NOTE — ED Notes (Signed)
ED Provider at bedside. 

## 2019-04-23 ENCOUNTER — Telehealth: Payer: Self-pay | Admitting: Medical

## 2019-04-23 NOTE — Telephone Encounter (Signed)
Dismissal letter in guarantor snapshot  °

## 2022-12-23 ENCOUNTER — Encounter: Payer: Self-pay | Admitting: Internal Medicine

## 2022-12-23 ENCOUNTER — Ambulatory Visit: Payer: No Typology Code available for payment source | Admitting: Internal Medicine

## 2022-12-23 VITALS — BP 138/80 | HR 53 | Resp 16 | Ht 71.0 in | Wt 187.0 lb

## 2022-12-23 DIAGNOSIS — R002 Palpitations: Secondary | ICD-10-CM

## 2022-12-23 DIAGNOSIS — R0602 Shortness of breath: Secondary | ICD-10-CM

## 2022-12-23 DIAGNOSIS — R011 Cardiac murmur, unspecified: Secondary | ICD-10-CM
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
## 2023-01-26 ENCOUNTER — Other Ambulatory Visit: Payer: No Typology Code available for payment source

## 2023-02-06 ENCOUNTER — Ambulatory Visit: Payer: Self-pay | Admitting: Internal Medicine

## 2023-02-06 NOTE — Progress Notes (Signed)
No show

## 2023-04-18 ENCOUNTER — Other Ambulatory Visit: Payer: No Typology Code available for payment source

## 2023-05-03 ENCOUNTER — Ambulatory Visit: Payer: Self-pay | Admitting: Cardiology

## 2023-05-11 ENCOUNTER — Ambulatory Visit (INDEPENDENT_AMBULATORY_CARE_PROVIDER_SITE_OTHER): Payer: No Typology Code available for payment source

## 2023-05-11 ENCOUNTER — Ambulatory Visit
Admission: RE | Admit: 2023-05-11 | Discharge: 2023-05-11 | Disposition: A | Discharge status: Home / Self Care | Payer: No Typology Code available for payment source | Source: Ambulatory Visit | Attending: Internal Medicine | Admitting: Internal Medicine

## 2023-05-11 VITALS — BP 157/88 | HR 53 | Temp 98.0°F | Resp 16

## 2023-05-11 DIAGNOSIS — M79671 Pain in right foot: Secondary | ICD-10-CM | POA: Diagnosis not present

## 2023-05-11 MED ORDER — PREDNISONE 20 MG PO TABS: 40.0000 mg | ORAL_TABLET | Freq: Every day | ORAL | 0 refills | Status: AC
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
## 2023-05-11 NOTE — Discharge Instructions (Signed)
I am suspicious that you have gout.  I have prescribed prednisone to treat this.  Avoid high purine foods.  See attached instructions.  Follow-up if any symptoms persist or worsen.

## 2023-05-11 NOTE — ED Triage Notes (Signed)
C/O right great toe and distal foot pain onset 4 days ago without injury. Mild swelling noted to area. Denies hx gout. Has tried [facility] without relief.

## 2023-05-15 LAB — URIC ACID

## 2023-05-15 LAB — SPECIMEN STATUS REPORT

## 2023-06-08 ENCOUNTER — Other Ambulatory Visit: Payer: No Typology Code available for payment source

## 2023-06-15 ENCOUNTER — Ambulatory Visit: Payer: Self-pay | Admitting: Cardiology

## 2023-06-15 NOTE — Progress Notes (Deleted)
Follow up visit  Subjective:   Sean Mack, male    DOB: 1966-01-11, 57 y.o.   MRN: [id number]    *** HPI  No chief complaint on file.   57 y.o. *** male with ***  ***  *** No current outpatient medications on file.   Cardiovascular & other pertient studies:  Reviewed external labs and tests, independently interpreted  *** EKG ***/***/202***: ***  ***  Recent labs: ***/***/202***: Glucose ***, BUN/Cr ***/***. EGFR ***. Na/K ***/***. ***Rest of the CMP normal H/H ***/***. MCV ***. Platelets *** ***HbA1C ***% Chol ***, TG ***, HDL ***, LDL *** ***TSH ***normal   *** ROS      *** There were no vitals filed for this visit.  There is no height or weight on file to calculate BMI. There were no vitals filed for this visit.  *** Objective:   Physical Exam          Visit diagnoses: No diagnosis found.   No orders of the defined types were placed in this encounter.    Medication changes this visit: There are no discontinued medications.  No orders of the defined types were placed in this encounter.    Assessment & Recommendations:   57 y.o.       [name], MD Pager: [phone] Office: [phone]

## 2024-06-06 ENCOUNTER — Other Ambulatory Visit: Payer: Self-pay | Admitting: Orthopedic Surgery

## 2024-07-15 ENCOUNTER — Encounter (HOSPITAL_BASED_OUTPATIENT_CLINIC_OR_DEPARTMENT_OTHER): Payer: Self-pay | Admitting: Orthopedic Surgery

## 2024-07-19 NOTE — Anesthesia Preprocedure Evaluation (Addendum)
 Anesthesia Evaluation  Patient identified by MRN, date of birth, ID band Patient awake    Reviewed: Allergy & Precautions, NPO status , Patient's Chart, lab work & pertinent test results  History of Anesthesia Complications Negative for: history of anesthetic complications  Airway Mallampati: III  TM Distance: >3 FB Neck ROM: Full    Dental no notable dental hx. (+) Teeth Intact   Pulmonary neg pulmonary ROS, neg sleep apnea, neg COPD, Patient abstained from smoking.Not current smoker H/o TB as a child s/p treatment   Pulmonary exam normal breath sounds clear to auscultation       Cardiovascular Exercise Tolerance: Good METS(-) hypertension(-) CAD and (-) Past MI + dysrhythmias (palpitations) + Valvular Problems/Murmurs  Rhythm:Regular Rate:Normal - Systolic murmurs  Hx palpitations, chest pain. Cardiologist thought to be more anxiety related. METS > 4. No TTE in system.   Neuro/Psych negative neurological ROS  negative psych ROS   GI/Hepatic negative GI ROS,neg GERD  ,,(+)     (-) substance abuse  marijuana use  Endo/Other  negative endocrine ROSneg diabetes    Renal/GU negative Renal ROS  negative genitourinary   Musculoskeletal negative musculoskeletal ROS (+)    Abdominal   Peds  Hematology negative hematology ROS (+)   Anesthesia Other Findings Past Medical History: No date: History of tuberculosis     Comment:  58yo, treated and resolved No date: Joint pain     Comment:  right knee, left wrist, ortho consult 10/2010 No date: Pneumonia     Comment:  age 89 No date: Wears glasses  Reproductive/Obstetrics negative OB ROS                              Anesthesia Physical Anesthesia Plan  ASA: 2  Anesthesia Plan: General   Post-op Pain Management: Tylenol  PO (pre-op)* and Toradol IV (intra-op)*   Induction: Intravenous  PONV Risk Score and Plan: 2 and Ondansetron,  Dexamethasone, Midazolam and Treatment may vary due to age or medical condition  Airway Management Planned: LMA  Additional Equipment: None  Intra-op Plan:   Post-operative Plan: Extubation in OR  Informed Consent: I have reviewed the patients History and Physical, chart, labs and discussed the procedure including the risks, benefits and alternatives for the proposed anesthesia with the patient or authorized representative who has indicated his/her understanding and acceptance.     Dental advisory given  Plan Discussed with: CRNA and Surgeon  Anesthesia Plan Comments: (Discussed risks of anesthesia with patient, including PONV, sore throat, lip/dental/eye damage. Rare risks discussed as well, such as cardiorespiratory and neurological sequelae, and allergic reactions. Discussed the role of CRNA in patient's perioperative care. Patient understands.)         Anesthesia Quick Evaluation

## 2024-07-22 DIAGNOSIS — Z01818 Encounter for other preprocedural examination: Secondary | ICD-10-CM

## 2024-08-21 ENCOUNTER — Other Ambulatory Visit: Payer: Self-pay

## 2024-08-21 ENCOUNTER — Encounter (HOSPITAL_BASED_OUTPATIENT_CLINIC_OR_DEPARTMENT_OTHER): Payer: Self-pay | Admitting: Orthopedic Surgery

## 2024-08-26 ENCOUNTER — Ambulatory Visit (HOSPITAL_BASED_OUTPATIENT_CLINIC_OR_DEPARTMENT_OTHER): Payer: Self-pay | Admitting: Anesthesiology

## 2024-08-26 ENCOUNTER — Encounter (HOSPITAL_BASED_OUTPATIENT_CLINIC_OR_DEPARTMENT_OTHER)
Admission: RE | Disposition: A | Discharge status: Home / Self Care | Payer: Self-pay | Source: Home / Self Care | Attending: Orthopedic Surgery

## 2024-08-26 ENCOUNTER — Other Ambulatory Visit: Payer: Self-pay

## 2024-08-26 ENCOUNTER — Ambulatory Visit (HOSPITAL_BASED_OUTPATIENT_CLINIC_OR_DEPARTMENT_OTHER)
Admission: RE | Admit: 2024-08-26 | Discharge: 2024-08-26 | Disposition: A | Discharge status: Home / Self Care | Attending: Orthopedic Surgery | Admitting: Orthopedic Surgery

## 2024-08-26 ENCOUNTER — Encounter (HOSPITAL_BASED_OUTPATIENT_CLINIC_OR_DEPARTMENT_OTHER): Payer: Self-pay | Admitting: Orthopedic Surgery

## 2024-08-26 DIAGNOSIS — G5622 Lesion of ulnar nerve, left upper limb: Secondary | ICD-10-CM | POA: Insufficient documentation

## 2024-08-26 DIAGNOSIS — G5602 Carpal tunnel syndrome, left upper limb: Secondary | ICD-10-CM | POA: Diagnosis present

## 2024-08-26 DIAGNOSIS — Z01818 Encounter for other preprocedural examination: Secondary | ICD-10-CM

## 2024-08-26 HISTORY — PX: ULNAR NERVE TRANSPOSITION: SHX2595

## 2024-08-26 HISTORY — PX: CARPAL TUNNEL RELEASE: SHX101

## 2024-08-26 SURGERY — CARPAL TUNNEL RELEASE
Anesthesia: General | Site: Wrist | Laterality: Left

## 2024-08-26 MED ORDER — DEXAMETHASONE SOD PHOSPHATE PF 10 MG/ML IJ SOLN
INTRAMUSCULAR | Status: DC | PRN
  Administered 2024-08-26: 10 mg via INTRAVENOUS

## 2024-08-26 MED ORDER — ONDANSETRON HCL 4 MG/2ML IJ SOLN
INTRAMUSCULAR | Status: DC | PRN
  Administered 2024-08-26: 4 mg via INTRAVENOUS

## 2024-08-26 MED ORDER — AMISULPRIDE (ANTIEMETIC) 5 MG/2ML IV SOLN: 10.0000 mg | Freq: Once | INTRAVENOUS | Status: DC | PRN

## 2024-08-26 MED ORDER — EPHEDRINE 5 MG/ML INJ
INTRAVENOUS | Status: AC
  Filled 2024-08-26: qty 5

## 2024-08-26 MED ORDER — FENTANYL CITRATE (PF) 100 MCG/2ML IJ SOLN
INTRAMUSCULAR | Status: AC
  Filled 2024-08-26: qty 2

## 2024-08-26 MED ORDER — CEFAZOLIN SODIUM-DEXTROSE 2-4 GM/100ML-% IV SOLN
2.0000 g | INTRAVENOUS | Status: AC
  Administered 2024-08-26: 2 g via INTRAVENOUS

## 2024-08-26 MED ORDER — OXYCODONE HCL 5 MG PO TABS: 5.0000 mg | ORAL_TABLET | Freq: Once | ORAL | Status: DC | PRN

## 2024-08-26 MED ORDER — LIDOCAINE HCL (CARDIAC) PF 100 MG/5ML IV SOSY
PREFILLED_SYRINGE | INTRAVENOUS | Status: DC | PRN
  Administered 2024-08-26: 100 mg via INTRAVENOUS

## 2024-08-26 MED ORDER — EPHEDRINE SULFATE (PRESSORS) 25 MG/5ML IV SOSY
PREFILLED_SYRINGE | INTRAVENOUS | Status: DC | PRN
  Administered 2024-08-26 (×2): 5 mg via INTRAVENOUS

## 2024-08-26 MED ORDER — FENTANYL CITRATE (PF) 100 MCG/2ML IJ SOLN
INTRAMUSCULAR | Status: DC | PRN
  Administered 2024-08-26 (×2): 50 ug via INTRAVENOUS

## 2024-08-26 MED ORDER — MIDAZOLAM HCL 2 MG/2ML IJ SOLN
INTRAMUSCULAR | Status: AC
Start: 2024-08-26 — End: 2024-08-26
  Filled 2024-08-26: qty 2

## 2024-08-26 MED ORDER — ACETAMINOPHEN 500 MG PO TABS
1000.0000 mg | ORAL_TABLET | Freq: Once | ORAL | Status: AC
  Administered 2024-08-26: 1000 mg via ORAL

## 2024-08-26 MED ORDER — CEFAZOLIN SODIUM-DEXTROSE 2-4 GM/100ML-% IV SOLN
INTRAVENOUS | Status: AC
  Filled 2024-08-26: qty 100

## 2024-08-26 MED ORDER — PROPOFOL 10 MG/ML IV BOLUS
INTRAVENOUS | Status: DC | PRN
  Administered 2024-08-26: 200 mg via INTRAVENOUS

## 2024-08-26 MED ORDER — HYDROCODONE-ACETAMINOPHEN 5-325 MG PO TABS: 1.0000 | ORAL_TABLET | Freq: Four times a day (QID) | ORAL | 0 refills | Status: AC | PRN

## 2024-08-26 MED ORDER — BUPIVACAINE HCL (PF) 0.25 % IJ SOLN
INTRAMUSCULAR | Status: DC | PRN
  Administered 2024-08-26: 18 mL

## 2024-08-26 MED ORDER — KETOROLAC TROMETHAMINE 30 MG/ML IJ SOLN
INTRAMUSCULAR | Status: DC | PRN
Start: 2024-08-26 — End: 2024-08-26
  Administered 2024-08-26: 30 mg via INTRAVENOUS

## 2024-08-26 MED ORDER — DEXMEDETOMIDINE HCL IN NACL 80 MCG/20ML IV SOLN
INTRAVENOUS | Status: AC
  Filled 2024-08-26: qty 20

## 2024-08-26 MED ORDER — ONDANSETRON HCL 4 MG/2ML IJ SOLN: 4.0000 mg | Freq: Once | INTRAMUSCULAR | Status: DC | PRN

## 2024-08-26 MED ORDER — OXYCODONE HCL 5 MG/5ML PO SOLN: 5.0000 mg | Freq: Once | ORAL | Status: DC | PRN

## 2024-08-26 MED ORDER — 0.9 % SODIUM CHLORIDE (POUR BTL) OPTIME
TOPICAL | Status: DC | PRN
Start: 2024-08-26 — End: 2024-08-26
  Administered 2024-08-26: 250 mL

## 2024-08-26 MED ORDER — HYDROMORPHONE HCL 1 MG/ML IJ SOLN: 0.2500 mg | INTRAMUSCULAR | Status: DC | PRN

## 2024-08-26 MED ORDER — PROPOFOL 500 MG/50ML IV EMUL
INTRAVENOUS | Status: AC
  Filled 2024-08-26: qty 100

## 2024-08-26 MED ORDER — MIDAZOLAM HCL 2 MG/2ML IJ SOLN
INTRAMUSCULAR | Status: AC
  Filled 2024-08-26: qty 2

## 2024-08-26 MED ORDER — MIDAZOLAM HCL 5 MG/5ML IJ SOLN
INTRAMUSCULAR | Status: DC | PRN
  Administered 2024-08-26: 2 mg via INTRAVENOUS

## 2024-08-26 MED ORDER — KETOROLAC TROMETHAMINE 30 MG/ML IJ SOLN: 30.0000 mg | Freq: Once | INTRAMUSCULAR | Status: DC | PRN

## 2024-08-26 MED ORDER — ACETAMINOPHEN 500 MG PO TABS
ORAL_TABLET | ORAL | Status: AC
  Filled 2024-08-26: qty 2

## 2024-08-26 MED ORDER — LACTATED RINGERS IV SOLN: INTRAVENOUS | Status: DC

## 2024-08-26 SURGICAL SUPPLY — 45 items
BLADE MINI RND TIP GREEN BEAV (BLADE) IMPLANT
BLADE SURG 15 STRL LF DISP TIS (BLADE) ×4 IMPLANT
BNDG COMPR ESMARK 4X3 LF (GAUZE/BANDAGES/DRESSINGS) ×2 IMPLANT
BNDG ELASTIC 3INX 5YD STR LF (GAUZE/BANDAGES/DRESSINGS) ×4 IMPLANT
BNDG ELASTIC 4INX 5YD STR LF (GAUZE/BANDAGES/DRESSINGS) ×2 IMPLANT
BNDG GAUZE DERMACEA FLUFF 4 (GAUZE/BANDAGES/DRESSINGS) ×2 IMPLANT
CHLORAPREP W/TINT 26 (MISCELLANEOUS) ×2 IMPLANT
CLIP TI MEDIUM 6 (CLIP) IMPLANT
CORD BIPOLAR FORCEPS 12FT (ELECTRODE) ×2 IMPLANT
COVER BACK TABLE 60X90IN (DRAPES) ×2 IMPLANT
COVER MAYO STAND STRL (DRAPES) ×2 IMPLANT
CUFF TOURN SGL QUICK 18X3 (MISCELLANEOUS) ×2 IMPLANT
CUFF TOURN SGL QUICK 18X4 (TOURNIQUET CUFF) ×2 IMPLANT
DRAPE EXTREMITY T 121X128X90 (DISPOSABLE) ×2 IMPLANT
DRAPE SURG 17X23 STRL (DRAPES) ×2 IMPLANT
GAUZE 4X4 16PLY ~~LOC~~+RFID DBL (SPONGE) IMPLANT
GAUZE PAD ABD 8X10 STRL (GAUZE/BANDAGES/DRESSINGS) ×2 IMPLANT
GAUZE SPONGE 4X4 12PLY STRL (GAUZE/BANDAGES/DRESSINGS) ×2 IMPLANT
GAUZE XEROFORM 1X8 LF (GAUZE/BANDAGES/DRESSINGS) ×2 IMPLANT
GLOVE BIO SURGEON STRL SZ7.5 (GLOVE) ×2 IMPLANT
GLOVE BIOGEL PI IND STRL 8 (GLOVE) ×2 IMPLANT
GLOVE BIOGEL PI IND STRL 8.5 (GLOVE) ×2 IMPLANT
GLOVE SURG ORTHO 8.0 STRL STRW (GLOVE) ×2 IMPLANT
GOWN STRL REUS W/ TWL LRG LVL3 (GOWN DISPOSABLE) ×2 IMPLANT
GOWN STRL REUS W/TWL XL LVL3 (GOWN DISPOSABLE) ×4 IMPLANT
NDL HYPO 25X1 1.5 SAFETY (NEEDLE) ×2 IMPLANT
NEEDLE HYPO 25X1 1.5 SAFETY (NEEDLE) ×4 IMPLANT
PACK BASIN DAY SURGERY FS (CUSTOM PROCEDURE TRAY) ×2 IMPLANT
PAD CAST 3X4 CTTN HI CHSV (CAST SUPPLIES) ×2 IMPLANT
PAD CAST 4YDX4 CTTN HI CHSV (CAST SUPPLIES) ×2 IMPLANT
PADDING CAST ABS COTTON 4X4 ST (CAST SUPPLIES) ×2 IMPLANT
SLEEVE SCD COMPRESS KNEE MED (STOCKING) ×2 IMPLANT
SLING ARM FOAM STRAP XLG (SOFTGOODS) IMPLANT
SOLN 0.9% NACL POUR BTL 1000ML (IV SOLUTION) ×2 IMPLANT
SPIKE FLUID TRANSFER (MISCELLANEOUS) IMPLANT
SPLINT PLASTER CAST FAST 5X30 (CAST SUPPLIES) IMPLANT
SPLINT PLASTER CAST XFAST 3X15 (CAST SUPPLIES) IMPLANT
STOCKINETTE 4X48 STRL (DRAPES) ×2 IMPLANT
SUT ETHILON 4 0 PS 2 18 (SUTURE) ×2 IMPLANT
SUT VIC AB 2-0 SH 27XBRD (SUTURE) ×2 IMPLANT
SUT VIC AB 4-0 PS2 18 (SUTURE) IMPLANT
SYR BULB EAR ULCER 3OZ GRN STR (SYRINGE) ×2 IMPLANT
SYR CONTROL 10ML LL (SYRINGE) ×2 IMPLANT
TOWEL GREEN STERILE FF (TOWEL DISPOSABLE) ×4 IMPLANT
UNDERPAD 30X36 HEAVY ABSORB (UNDERPADS AND DIAPERS) ×2 IMPLANT

## 2024-08-26 NOTE — Transfer of Care (Signed)
 Immediate Anesthesia Transfer of Care Note  Patient: Sean Mack  Procedure(s) Performed: CARPAL TUNNEL RELEASE (Left: Wrist) ULNAR NERVE DECOMPRESSION/TRANSPOSITION (Left: Arm Upper)  Patient Location: PACU  Anesthesia Type:General  Level of Consciousness: drowsy  Airway & Oxygen Therapy: Patient Spontanous Breathing and Patient connected to face mask oxygen  Post-op Assessment: Report given to RN and Post -op Vital signs reviewed and stable  Post vital signs: Reviewed and stable  Last Vitals:  Vitals Value Taken Time  BP 139/82 08/26/24 14:30  Temp    Pulse 51 08/26/24 14:33  Resp 12 08/26/24 14:33  SpO2 97 % 08/26/24 14:33  Vitals shown include unfiled device data.  Last Pain:  Vitals:   08/26/24 1101  TempSrc: Temporal  PainSc: 0-No pain         Complications: No notable events documented.

## 2024-08-26 NOTE — Op Note (Signed)
 SURGERY ASSISTANT NOTE  PLACE OF SERVICE: [facility]   PATIENT INFORMATION: Name: Sean Mack MRN#: [id number] DOB: 23-Apr-1966  Date of surgery: 08/26/2024 Time of surgery: 2:21 PM  SURGERY ASSISTANT NOTE:  Assistant name: Isaiah Anton, PA-C Note date: 08/26/2024  I assisted Dr. Franky Curia on the following procedure(s) for the above-noted patient in the date and time documented:   Left carpal tunnel release  Left cubital tunnel release    I provided assistance on the case as follows:  Assistance with exposure, retraction, bleeding control, protection of vital structures, instrumentation  and closure.   Isaiah Anton, PA-C

## 2024-08-26 NOTE — Op Note (Addendum)
 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 yo male with numbness and tingling left hand.  Nocturnal symptoms. Positive nerve conduction studies. He wishes to proceed with left carpal tunnel release.  Risks, benefits and alternatives of surgery were discussed including the risk of blood loss; infection; damage to nerves, vessels, tendons, ligaments, bone; failure of surgery; need for additional surgery; complications with wound healing; continued pain; recurrence of carpal tunnel syndrome; and damage to motor branch. He voiced understanding of these risks and elected to proceed.   OPERATIVE COURSE:  After being identified preoperatively by myself, the patient and I agreed upon the procedure and site of procedure.  The surgical site was marked.  Surgical consent had been signed.  He was given IV Ancef as preoperative antibiotic prophylaxis.  He was transferred to the operating room and placed on the operating room table in supine position with the left upper extremity on an armboard.  General anesthesia was induced by the anesthesiologist.  Left upper extremity was prepped and draped in normal sterile orthopaedic fashion.  A surgical pause was performed between the surgeons,  anesthesia, and operating room staff, and all were in agreement as to the patient, procedure, and site of procedure.  Tourniquet at the proximal aspect of the extremity was inflated to 250 mmHg after exsanguination of the arm with an Esmarch bandage  Incision was made over the transverse carpal ligament and carried into the subcutaneous tissues by spreading technique.  Bipolar electrocautery was used to obtain hemostasis.  The palmar fascia was sharply incised.  The transverse carpal ligament was identified.  The fascia distal to the ligament was opened.  Retractor was placed and the flexor tendons were identified.  The flexor tendon to the ring finger was identified and retracted radially.  There was muscle belly crossing the transverse carpal ligament.  This was released.  The transverse carpal ligament was then incised from distal to proximal under direct visualization.  Scissors were used to split the distal aspect of the volar antebrachial fascia.  A finger was placed into the wound to ensure complete decompression, which was the case.  The nerve was examined.  It was adherent to the radial leaflet.  The motor branch was identified and was intact.  The wound was copiously irrigated with sterile saline.  It was then closed with 4-0 nylon in a horizontal mattress fashion.  Incision was made at the medial side of the elbow.  This was carried in subcutaneous tissues by spreading technique.  Bipolar electrocautery was used to obtain hemostasis.  The ulnar nerve was identified proximal Osborne's ligament.  Osborne's ligament was released from  proximal to distal under direct visualization while protecting the nerve.  The muscular fascia over the FCU muscle was released and the FCU muscle belly spread.  The investing fascia over the nerve was then released.  The motor branches to the muscle bellies were protected.  The nerve was then decompressed proximally.  Again muscular fascia was released and the investing fascia  over the nerve released under direct visualization.  No remaining compression was noted.  The elbow was flexed and the nerve did not subluxate out of the groove.  The wound was copiously irrigated with sterile saline.  The anterior leaflet of Osborne's ligament was repaired to the posterior subcutaneous tissues with 2-0 Vicryl suture in a figure-of-eight fashion.  No compression was created.  Inverted interrupted 4-0 Vicryl suture was placed in the subcutaneous tissues and skin was closed with 4-0 nylon in a horizontal mattress fashion.  The wounds were injected with 0.25% plain Marcaine to aid in postoperative analgesia.  They were dressed with sterile Xeroform, 4x4s, an ABD, and wrapped with Kerlix and an Ace bandage.  Tourniquet was deflated at 44 minutes.  Fingertips were pink with brisk capillary refill after deflation of the tourniquet.  Operative drapes were broken down.  The patient was awoken from anesthesia safely.  He was transferred back to stretcher and taken to the PACU in stable condition.  I will see him back in the office in 1 week for postoperative followup.  I will give him a prescription for Norco 5/325 1 tab PO q6 hours prn pain, dispense #15.    Reigna Ruperto, MD Electronically signed, 08/26/24

## 2024-08-26 NOTE — Discharge Instructions (Addendum)
 No Tylenol  until after 5:15 today if needed.   No Ibuprofen until after 8:15 today if needed.  Post Anesthesia Home Care Instructions  Activity: Get plenty of rest for the remainder of the day. A responsible individual must stay with you for 24 hours following the procedure.  For the next 24 hours, DO NOT: -Drive a car -Advertising copywriter -Drink alcoholic beverages -Take any medication unless instructed by your physician -Make any legal decisions or sign important papers.  Meals: Start with liquid foods such as gelatin or soup. Progress to regular foods as tolerated. Avoid greasy, spicy, heavy foods. If nausea and/or vomiting occur, drink only clear liquids until the nausea and/or vomiting subsides. Call your physician if vomiting continues.  Special Instructions/Symptoms: Your throat may feel dry or sore from the anesthesia or the breathing tube placed in your throat during surgery. If this causes discomfort, gargle with warm salt water. The discomfort should disappear within 24 hours.  If you had a scopolamine patch placed behind your ear for the management of post- operative nausea and/or vomiting:  1. The medication in the patch is effective for 72 hours, after which it should be removed.  Wrap patch in a tissue and discard in the trash. Wash hands thoroughly with soap and water. 2. You may remove the patch earlier than 72 hours if you experience unpleasant side effects which may include dry mouth, dizziness or visual disturbances. 3. Avoid touching the patch. Wash your hands with soap and water after contact with the patch.    [facility] Instructions Hand Surgery  Wound Care: Keep your hand elevated above the level of your heart.  Do not allow it to dangle by your side.  Keep the dressing dry and do not remove it unless your doctor advises you to do so.  He will usually change it at the time of your post-op visit.  Moving your fingers is advised to stimulate circulation but will  depend on the site of your surgery.  If you have a splint applied, your doctor will advise you regarding movement.  Activity: Do not drive or operate machinery today.  Rest today and then you may return to your normal activity and work as indicated by your physician.  Diet:  Drink liquids today or eat a light diet.  You may resume a regular diet tomorrow.    General expectations: Pain for two to three days. Fingers may become slightly swollen.  Call your doctor if any of the following occur: Severe pain not relieved by pain medication. Elevated temperature. Dressing soaked with blood. Inability to move fingers. White or bluish color to fingers.
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
## 2024-08-26 NOTE — Anesthesia Procedure Notes (Signed)
 Procedure Name: LMA Insertion Date/Time: 08/26/2024 1:18 PM  Performed by: Debarah Chiquita LABOR, CRNAPre-anesthesia Checklist: Patient identified, Emergency Drugs available, Suction available and Patient being monitored Patient Re-evaluated:Patient Re-evaluated prior to induction Oxygen Delivery Method: Circle system utilized Preoxygenation: Pre-oxygenation with 100% oxygen Induction Type: IV induction Ventilation: Mask ventilation without difficulty LMA: LMA inserted LMA Size: 5.0 Number of attempts: 1 Airway Equipment and Method: Bite block Placement Confirmation: positive ETCO2 Tube secured with: Tape Dental Injury: Teeth and Oropharynx as per pre-operative assessment

## 2024-08-26 NOTE — Anesthesia Postprocedure Evaluation (Signed)
 Anesthesia Post Note  Patient: Sean Mack  Procedure(s) Performed: CARPAL TUNNEL RELEASE (Left: Wrist) ULNAR NERVE DECOMPRESSION/TRANSPOSITION (Left: Arm Upper)     Patient location during evaluation: PACU Anesthesia Type: General Level of consciousness: awake and alert Pain management: pain level controlled Vital Signs Assessment: post-procedure vital signs reviewed and stable Respiratory status: spontaneous breathing, nonlabored ventilation, respiratory function stable and patient connected to nasal cannula oxygen Cardiovascular status: blood pressure returned to baseline and stable Postop Assessment: no apparent nausea or vomiting Anesthetic complications: no   No notable events documented.  Last Vitals:  Vitals:   08/26/24 1445 08/26/24 1500  BP: 133/88 135/80  Pulse: (!) 57 (!) 59  Resp: 16 16  Temp:    SpO2: 100% 100%    Last Pain:  Vitals:   08/26/24 1500  TempSrc:   PainSc: Asleep                 Rome Ade

## 2024-08-27 ENCOUNTER — Encounter (HOSPITAL_BASED_OUTPATIENT_CLINIC_OR_DEPARTMENT_OTHER): Payer: Self-pay | Admitting: Orthopedic Surgery
